# Patient Record
Sex: Female | Born: 1980 | Race: Black or African American | Hispanic: No | Marital: Single | Smoking: Current every day smoker
Health system: Southern US, Community
[De-identification: ages and names within clinical notes are randomized; demographics above are authoritative.]

## PROBLEM LIST (undated history)

## (undated) DIAGNOSIS — R87810 Cervical high risk human papillomavirus (HPV) DNA test positive: Secondary | ICD-10-CM

## (undated) DIAGNOSIS — F419 Anxiety disorder, unspecified: Secondary | ICD-10-CM

## (undated) HISTORY — DX: Cervical high risk human papillomavirus (HPV) DNA test positive: R87.810

## (undated) HISTORY — DX: Anxiety disorder, unspecified: F41.9

## (undated) HISTORY — PX: TUBAL LIGATION: SHX77

---
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
## 1999-10-17 ENCOUNTER — Emergency Department (HOSPITAL_COMMUNITY): Admission: EM | Admit: 1999-10-17 | Discharge: 1999-10-17 | Payer: Self-pay | Admitting: *Deleted

## 2004-05-28 ENCOUNTER — Inpatient Hospital Stay (HOSPITAL_COMMUNITY): Admission: AD | Admit: 2004-05-28 | Discharge: 2004-05-28 | Payer: Self-pay | Admitting: Obstetrics & Gynecology

## 2004-12-14 ENCOUNTER — Ambulatory Visit: Payer: Self-pay | Admitting: Obstetrics and Gynecology

## 2005-01-04 ENCOUNTER — Ambulatory Visit: Payer: Self-pay | Admitting: Obstetrics and Gynecology

## 2005-05-10 ENCOUNTER — Ambulatory Visit: Payer: Self-pay | Admitting: Obstetrics and Gynecology

## 2006-04-23 ENCOUNTER — Inpatient Hospital Stay (HOSPITAL_COMMUNITY): Admission: AD | Admit: 2006-04-23 | Discharge: 2006-04-23 | Payer: Self-pay | Admitting: Obstetrics & Gynecology

## 2006-05-26 ENCOUNTER — Inpatient Hospital Stay (HOSPITAL_COMMUNITY): Admission: AD | Admit: 2006-05-26 | Discharge: 2006-05-26 | Payer: Self-pay | Admitting: Obstetrics and Gynecology

## 2006-06-26 ENCOUNTER — Ambulatory Visit (HOSPITAL_COMMUNITY): Admission: RE | Admit: 2006-06-26 | Discharge: 2006-06-26 | Payer: Self-pay | Admitting: Family Medicine

## 2006-07-13 ENCOUNTER — Ambulatory Visit: Payer: Self-pay | Admitting: Obstetrics & Gynecology

## 2006-07-14 ENCOUNTER — Ambulatory Visit: Payer: Self-pay | Admitting: Gynecology

## 2006-08-03 ENCOUNTER — Ambulatory Visit (HOSPITAL_COMMUNITY): Admission: RE | Admit: 2006-08-03 | Discharge: 2006-08-03 | Payer: Self-pay | Admitting: Obstetrics & Gynecology

## 2006-08-30 ENCOUNTER — Ambulatory Visit: Payer: Self-pay | Admitting: Obstetrics & Gynecology

## 2006-08-31 ENCOUNTER — Ambulatory Visit: Payer: Self-pay | Admitting: *Deleted

## 2006-09-01 ENCOUNTER — Ambulatory Visit: Payer: Self-pay | Admitting: Gynecology

## 2006-09-07 ENCOUNTER — Ambulatory Visit: Payer: Self-pay | Admitting: Obstetrics & Gynecology

## 2006-09-21 ENCOUNTER — Ambulatory Visit: Payer: Self-pay | Admitting: Obstetrics & Gynecology

## 2006-09-21 ENCOUNTER — Ambulatory Visit (HOSPITAL_COMMUNITY): Admission: RE | Admit: 2006-09-21 | Discharge: 2006-09-21 | Payer: Self-pay | Admitting: Obstetrics & Gynecology

## 2006-10-02 ENCOUNTER — Ambulatory Visit: Payer: Self-pay | Admitting: Obstetrics & Gynecology

## 2006-10-08 ENCOUNTER — Inpatient Hospital Stay (HOSPITAL_COMMUNITY): Admission: AD | Admit: 2006-10-08 | Discharge: 2006-10-11 | Payer: Self-pay | Admitting: Obstetrics & Gynecology

## 2006-10-08 ENCOUNTER — Ambulatory Visit: Payer: Self-pay | Admitting: Obstetrics & Gynecology

## 2006-10-13 ENCOUNTER — Inpatient Hospital Stay (HOSPITAL_COMMUNITY): Admission: AD | Admit: 2006-10-13 | Discharge: 2006-10-13 | Payer: Self-pay | Admitting: Gynecology

## 2006-10-13 ENCOUNTER — Ambulatory Visit: Payer: Self-pay | Admitting: *Deleted

## 2008-07-23 IMAGING — US US OB COMP +14 WK
1 series · 14 of 14 positions shown · non-contrast
Comparison: none

OBSTETRICAL ULTRASOUND:

 This ultrasound exam was performed in the [HOSPITAL] Ultrasound Department.  The OB US report was generated in the AS system, and faxed to the ordering physician.  This report is also available in [REDACTED] PACS.

[Series 1: us ob comp +14 wk · 0.24mm/px · 14 of 14 slices shown]
[im 1/14]
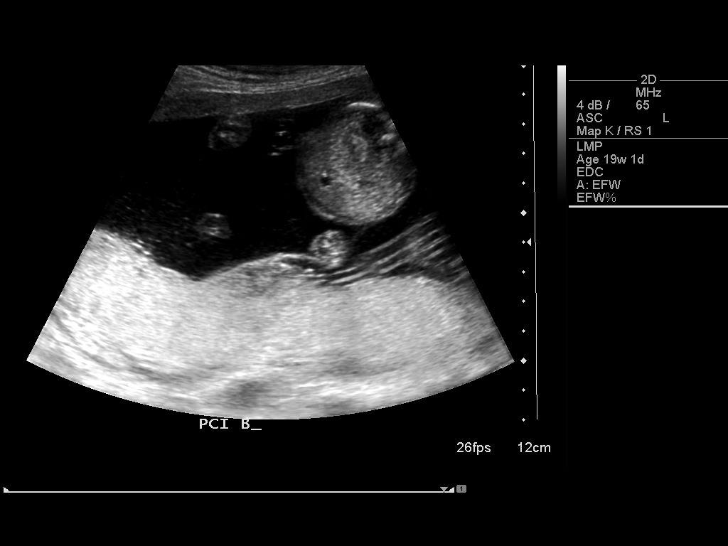
[im 2/14]
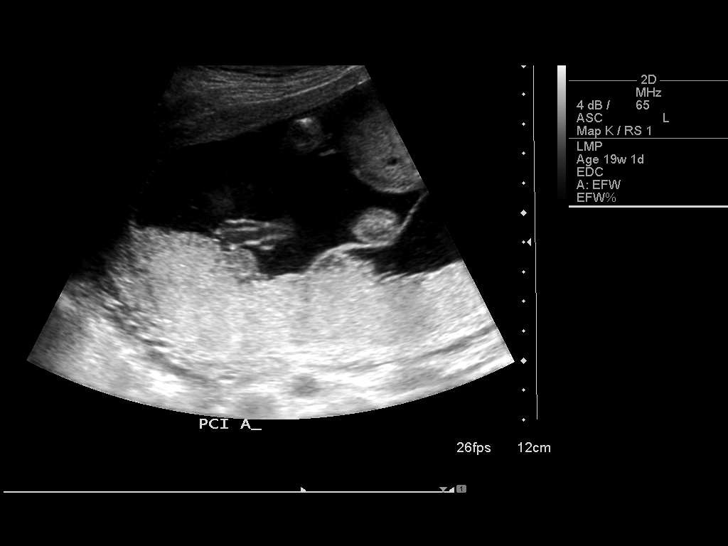
[im 3/14]
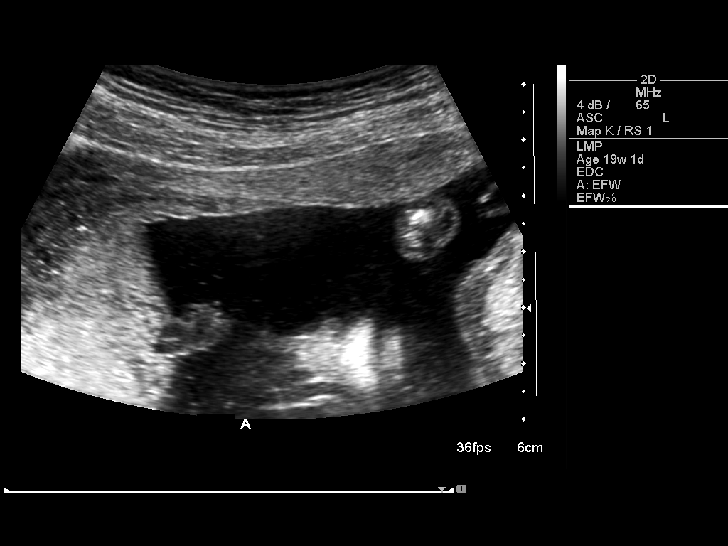
[im 4/14]
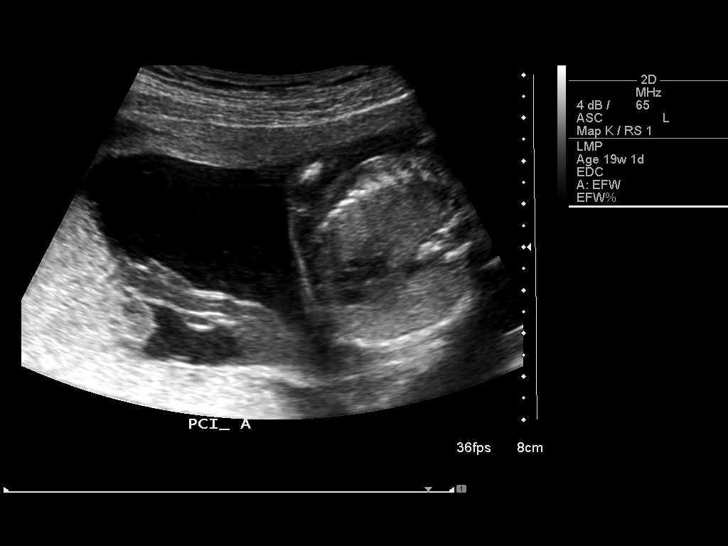
[im 5/14]
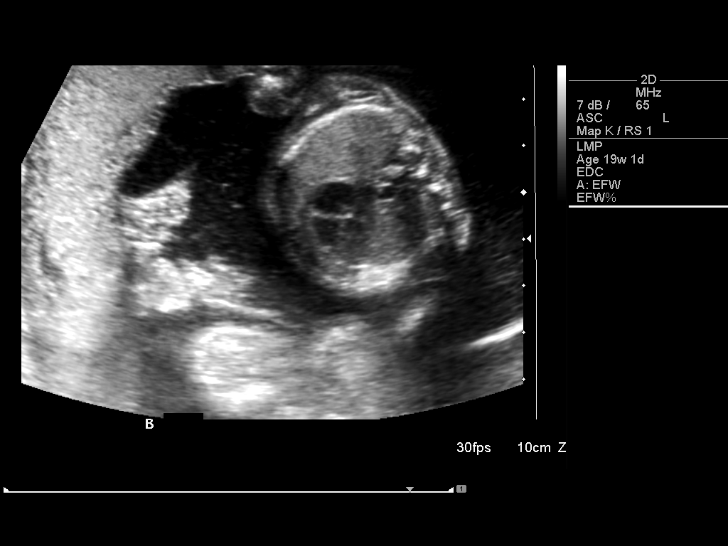
[im 6/14]
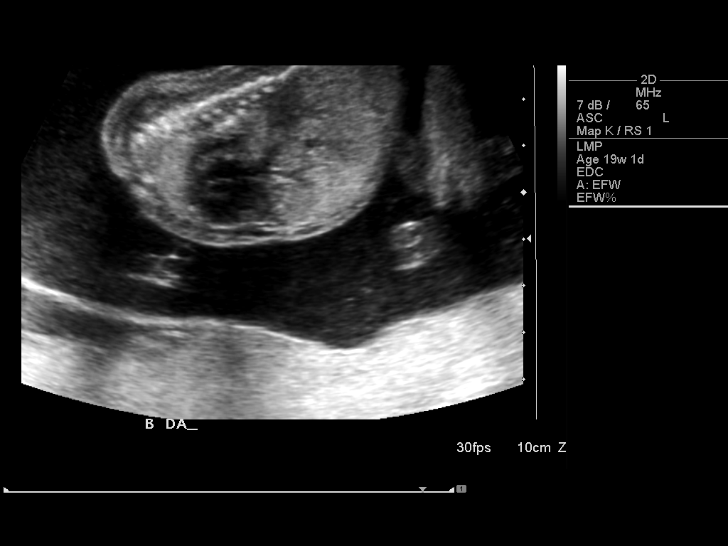
[im 7/14]
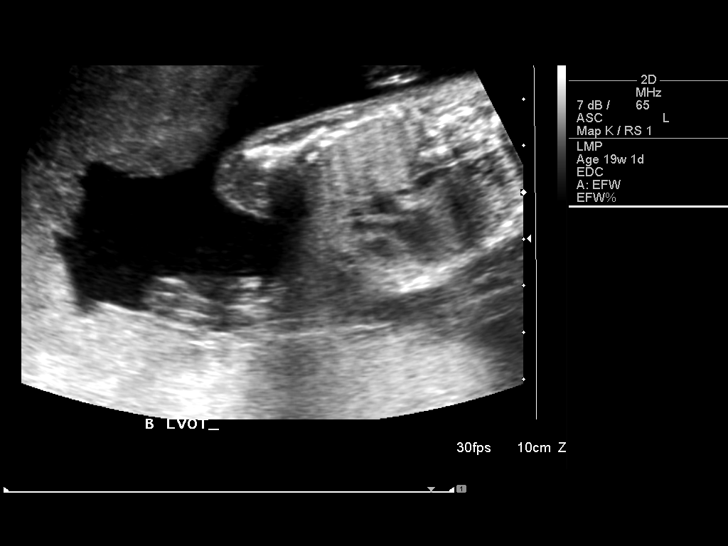
[im 8/14]
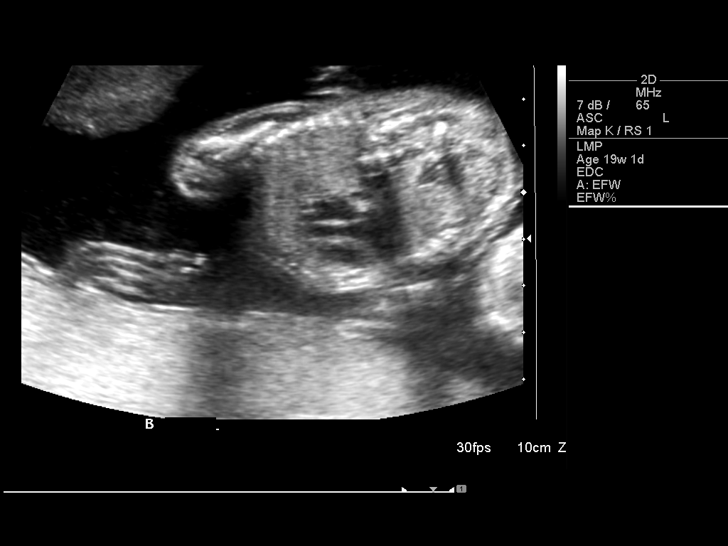
[im 9/14]
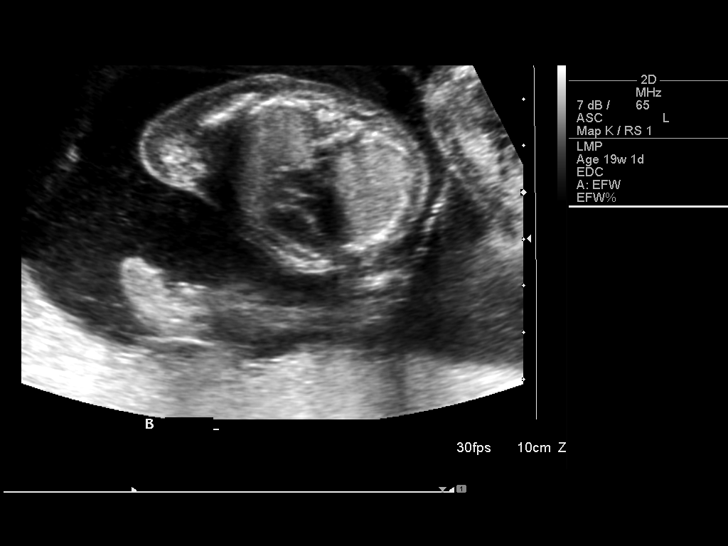
[im 10/14]
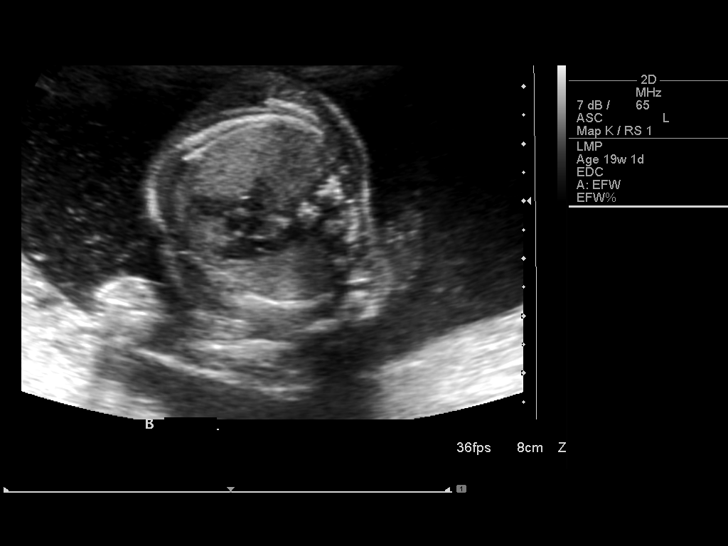
[im 11/14]
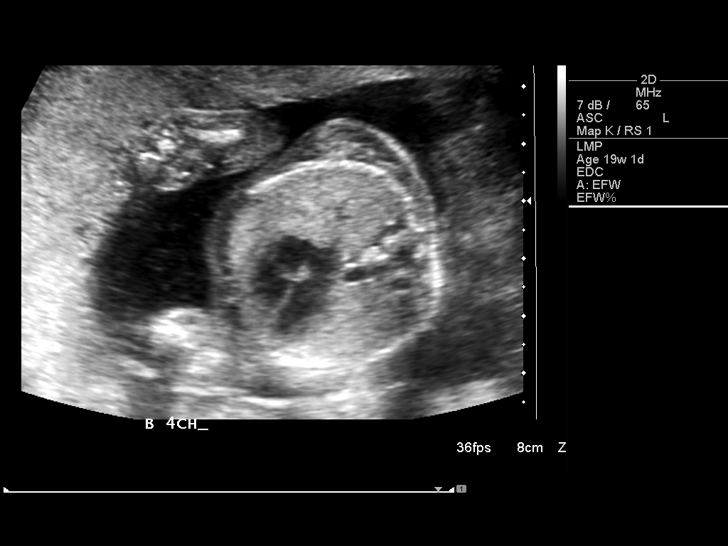
[im 12/14]
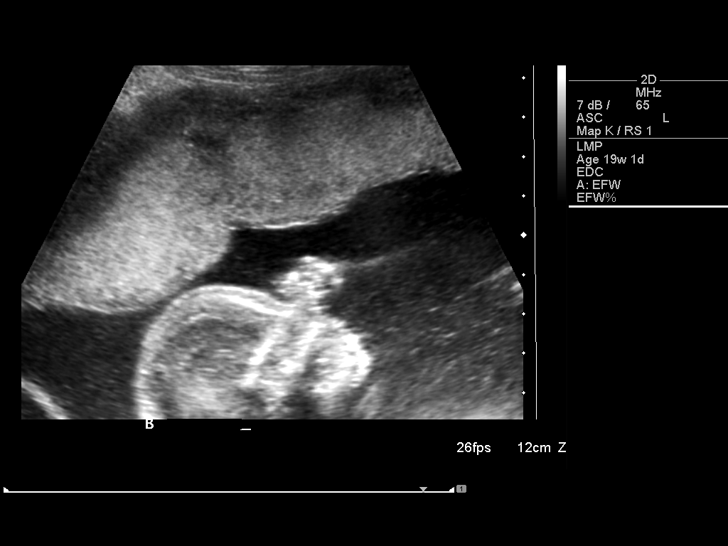
[im 13/14]
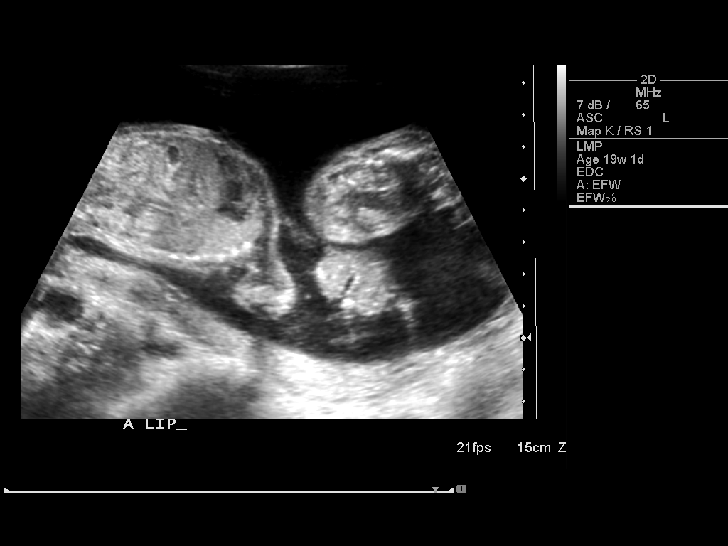
[im 14/14]
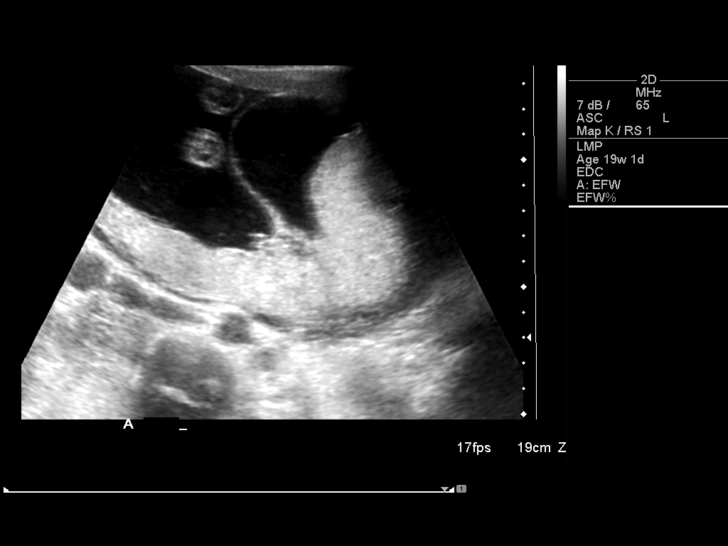

[14 of 14 positions shown; findings below may reference images not displayed]

IMPRESSION: See AS Obstetric US report.

## 2008-10-18 IMAGING — US US OB FOLLOW-UP
1 series · 14 of 28 positions shown · non-contrast
Comparison: none

OBSTETRICAL ULTRASOUND:

 This ultrasound exam was performed in the [HOSPITAL] Ultrasound Department.  The OB US report was generated in the AS system, and faxed to the ordering physician.  This report is also available in [REDACTED] PACS.

[Series 1: us ob follow-up · 14 of 58 slices shown]
[im 3/58]
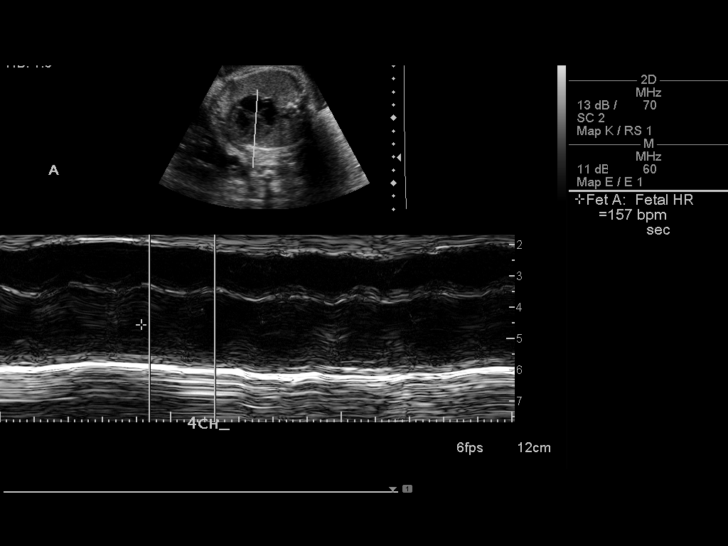
[im 7/58]
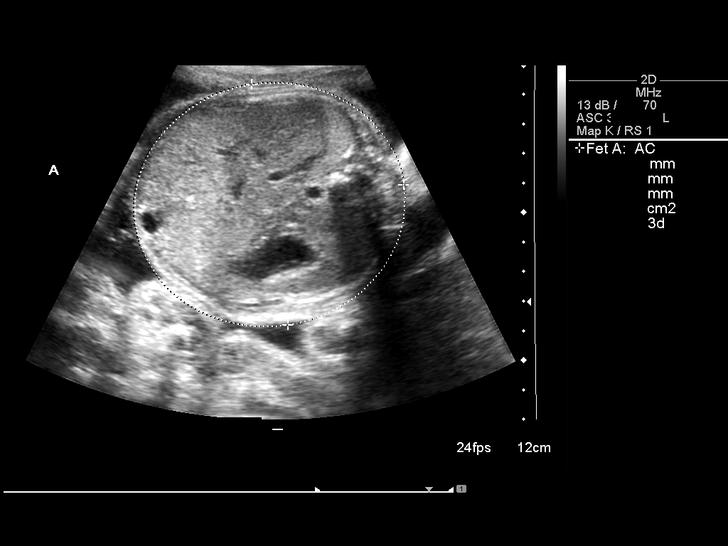
[im 11/58]
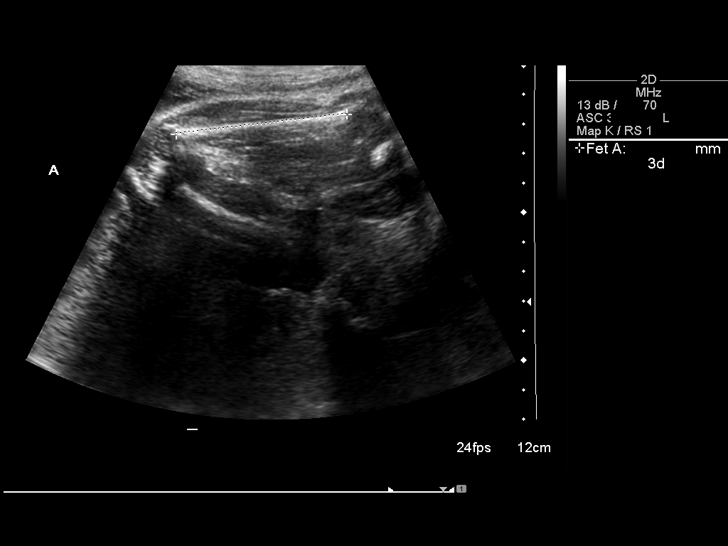
[im 15/58]
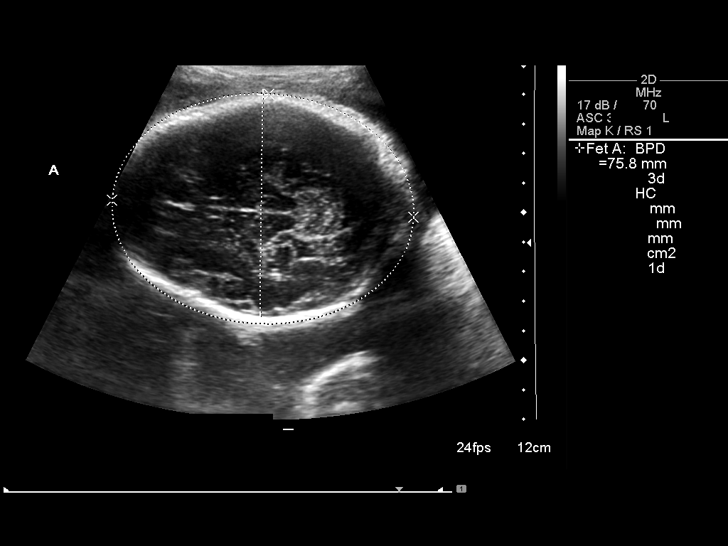
[im 20/58]
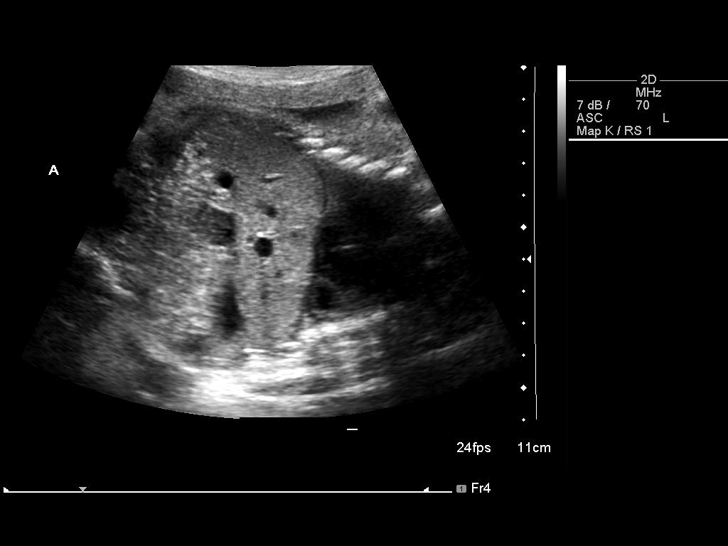
[im 24/58]
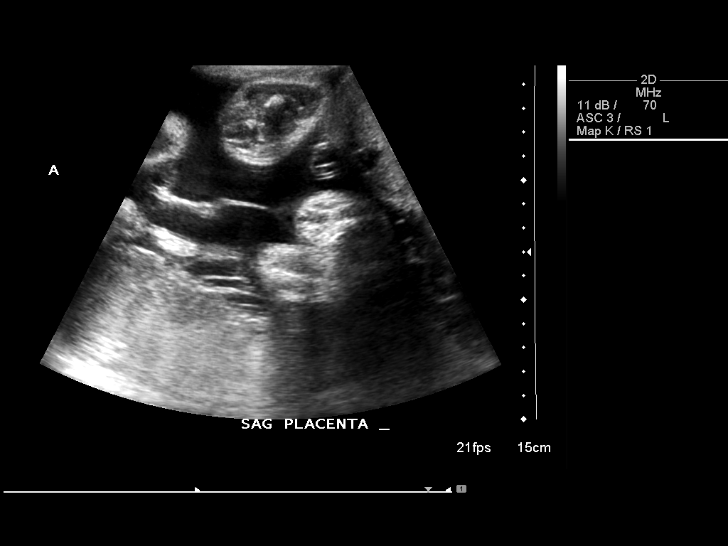
[im 28/58]
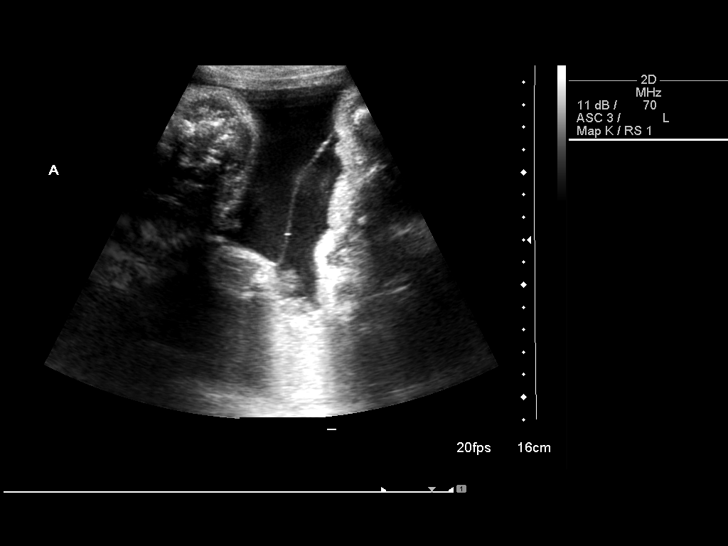
[im 32/58]
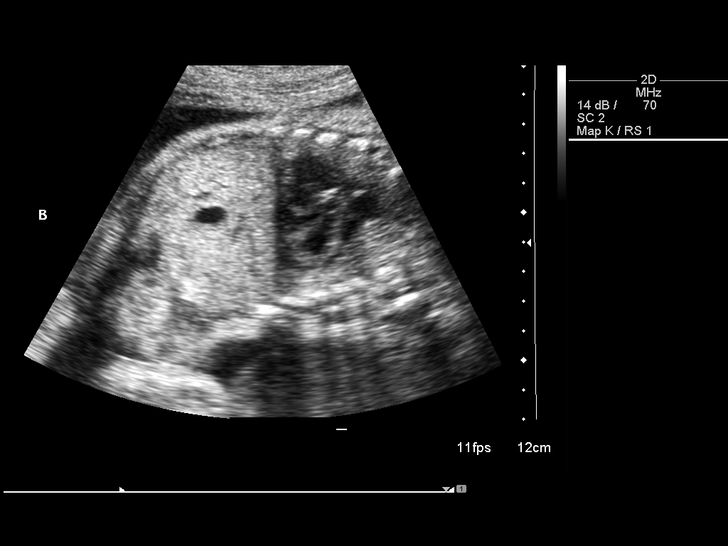
[im 36/58]
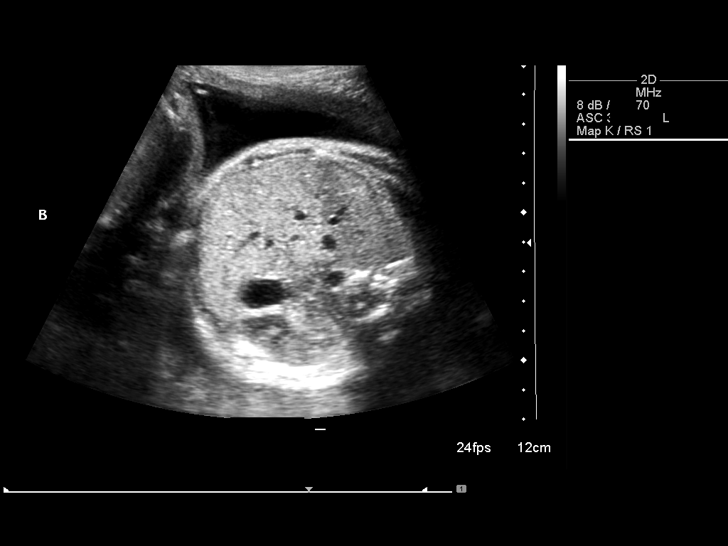
[im 41/58]
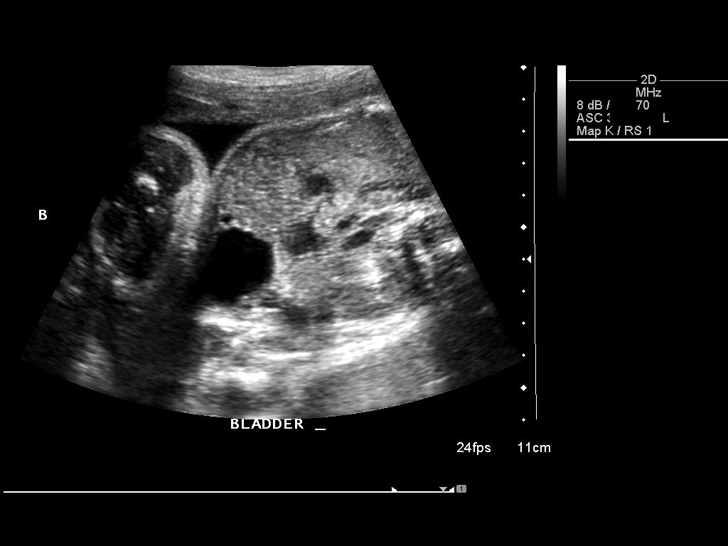
[im 45/58]
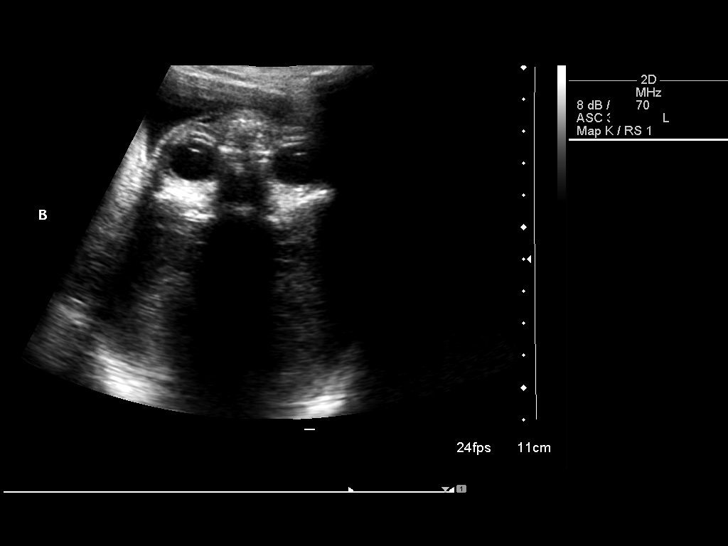
[im 49/58]
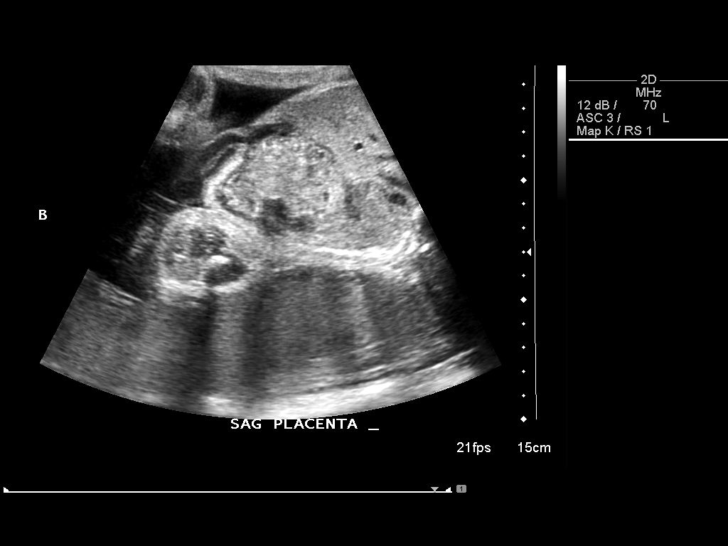
[im 53/58]
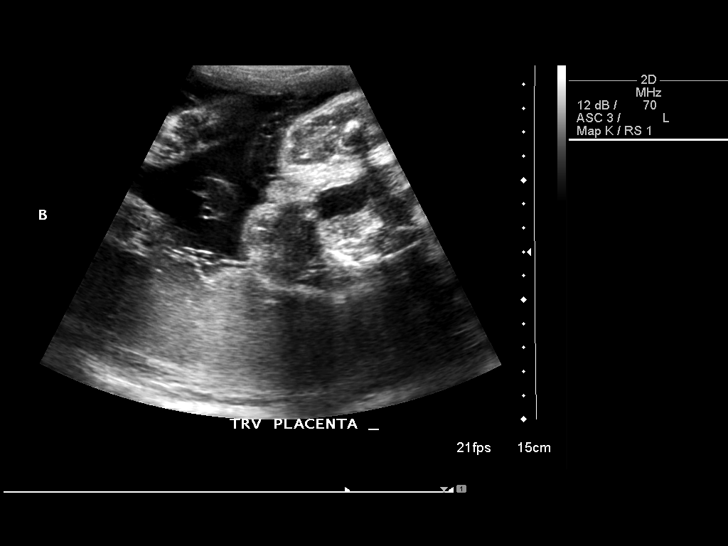
[im 58/58]
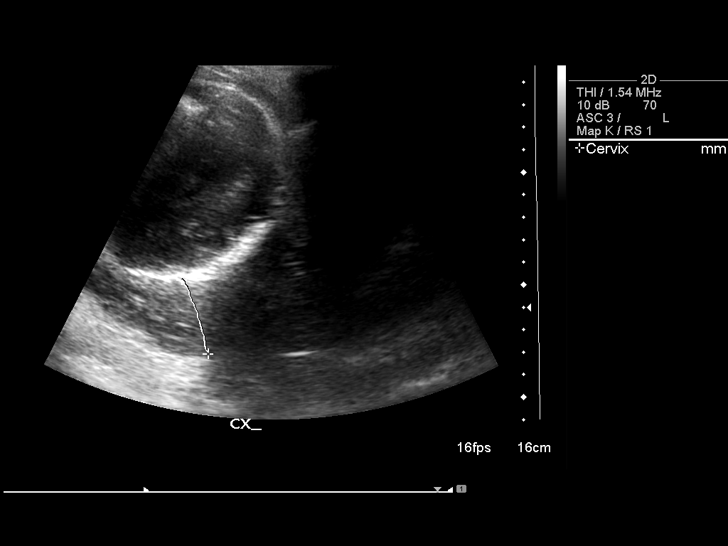

[14 of 28 positions shown; findings below may reference images not displayed]

IMPRESSION: See AS Obstetric US report.

## 2008-11-05 IMAGING — US US OB LIMITED
1 series · 14 of 24 positions shown · non-contrast
Comparison: none

OBSTETRICAL ULTRASOUND:

 This ultrasound exam was performed in the [HOSPITAL] Ultrasound Department.  The OB US report was generated in the AS system, and faxed to the ordering physician.  This report is also available in [REDACTED] PACS.

[Series 1: us ob limited · 0.32mm/px · 14 of 24 slices shown]
[im 1/24]
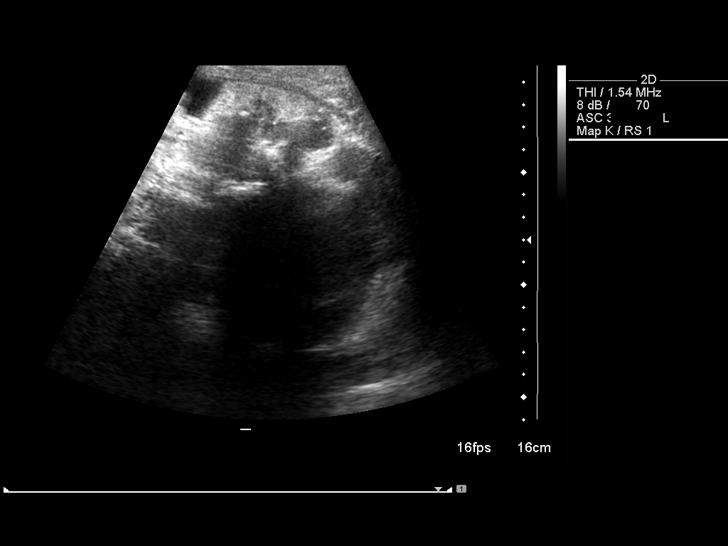
[im 3/24]
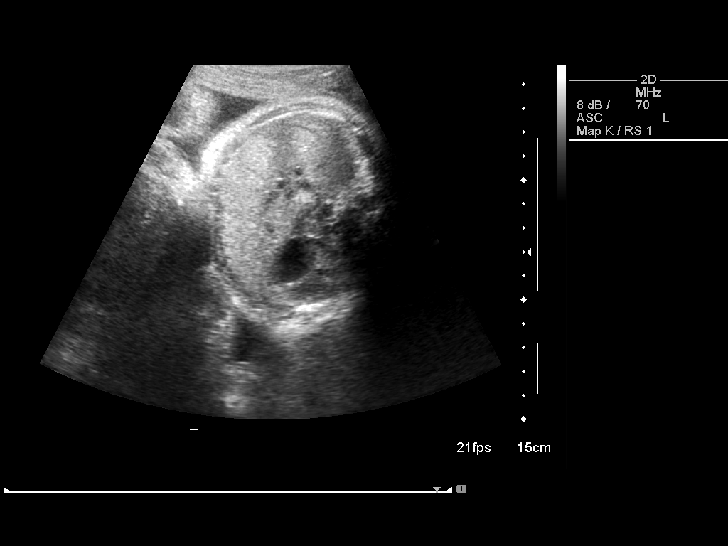
[im 5/24]
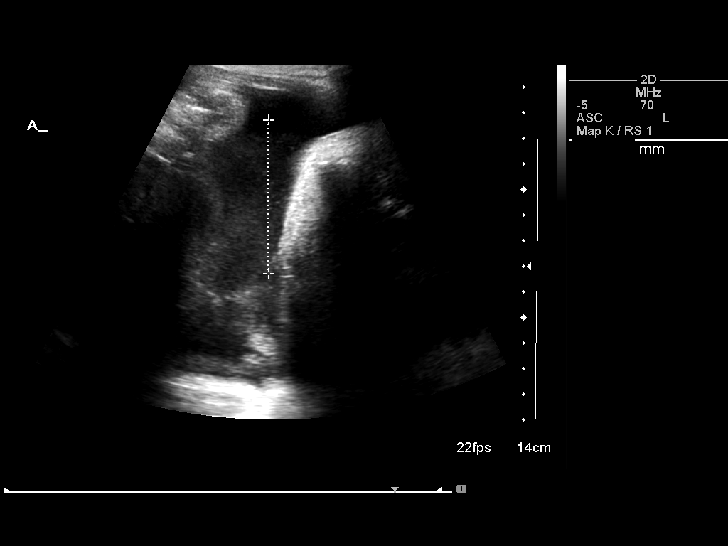
[im 7/24]
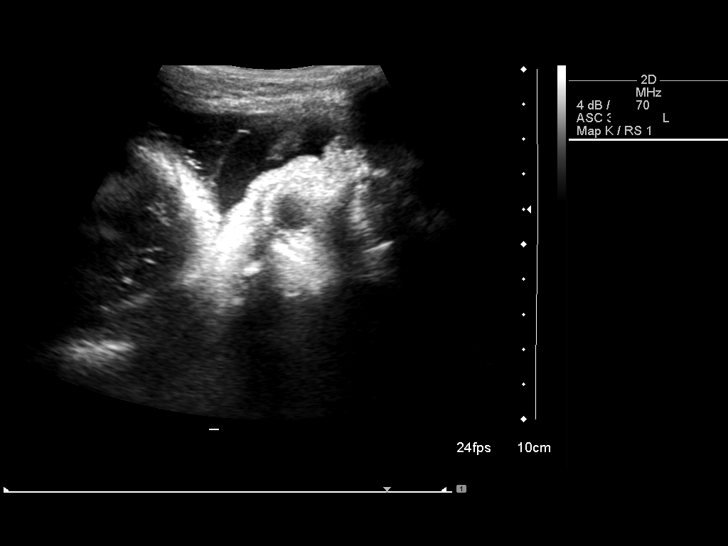
[im 8/24]
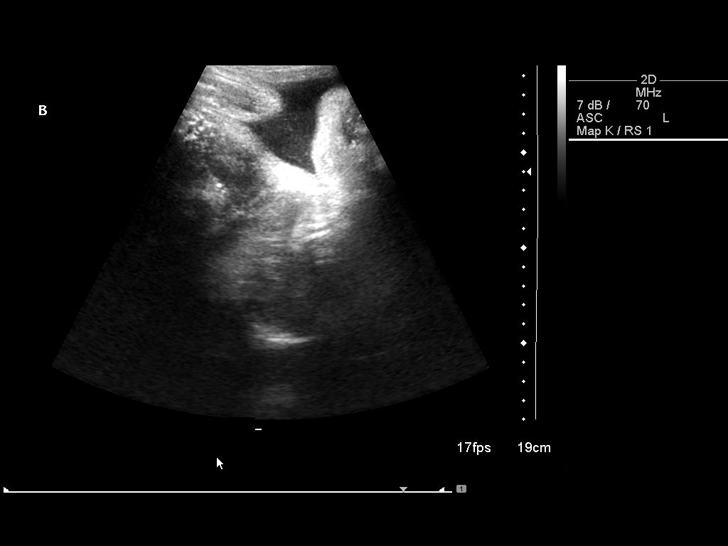
[im 10/24]
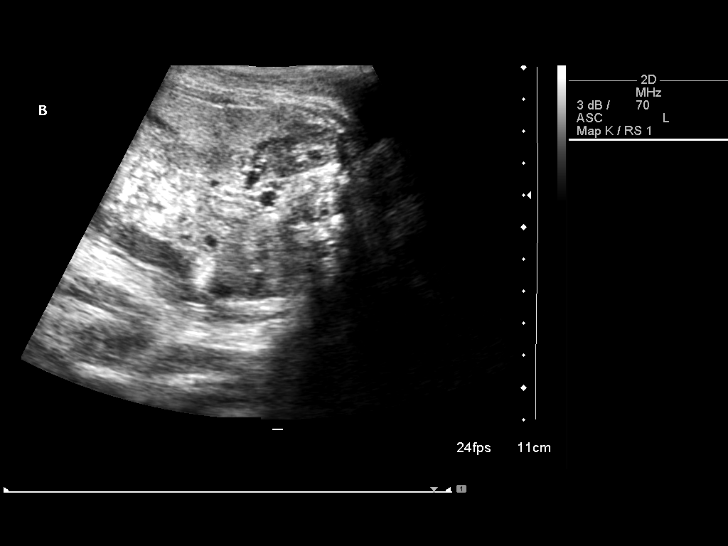
[im 12/24]
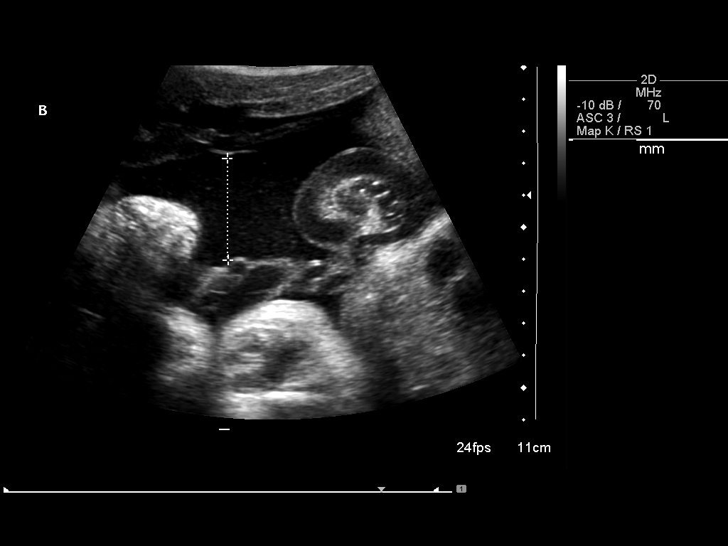
[im 13/24]
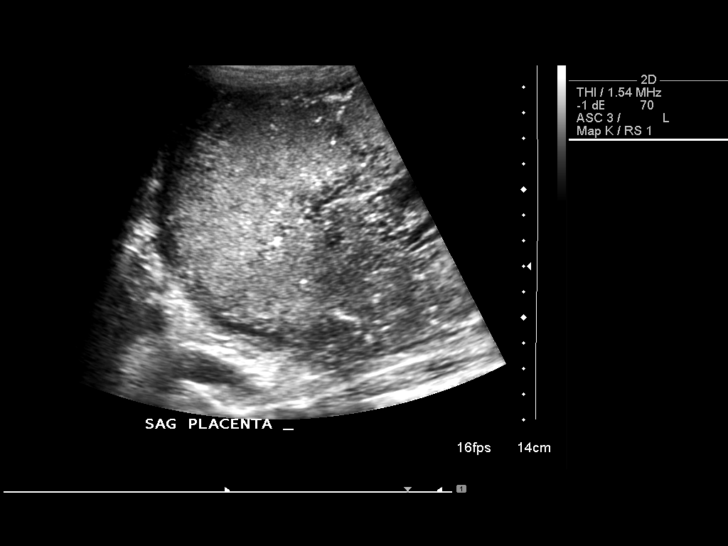
[im 15/24]
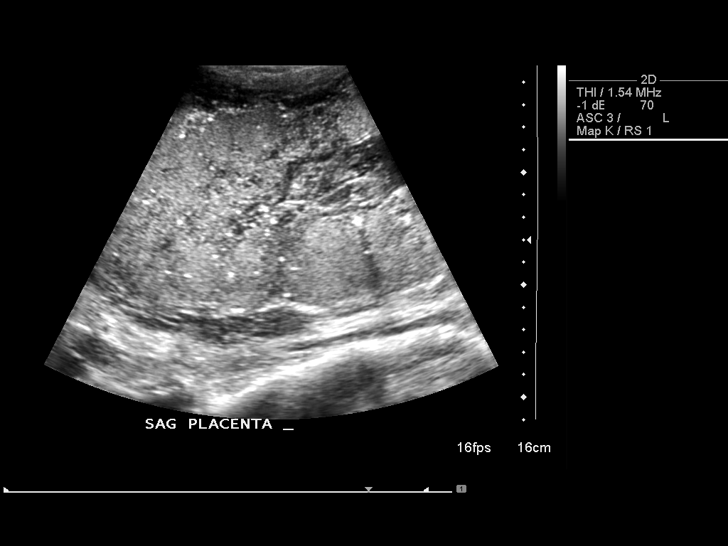
[im 17/24]
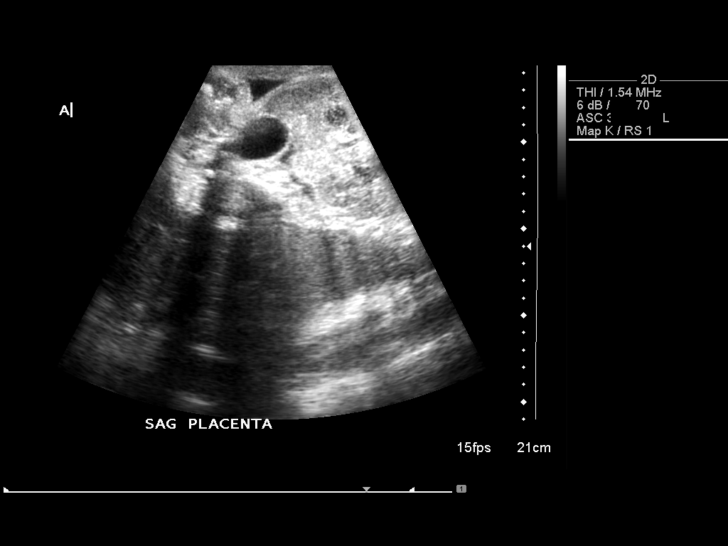
[im 19/24]
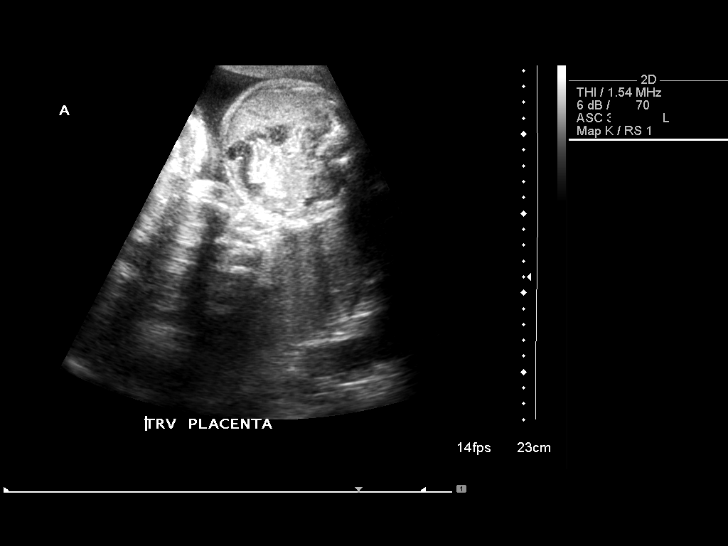
[im 20/24]
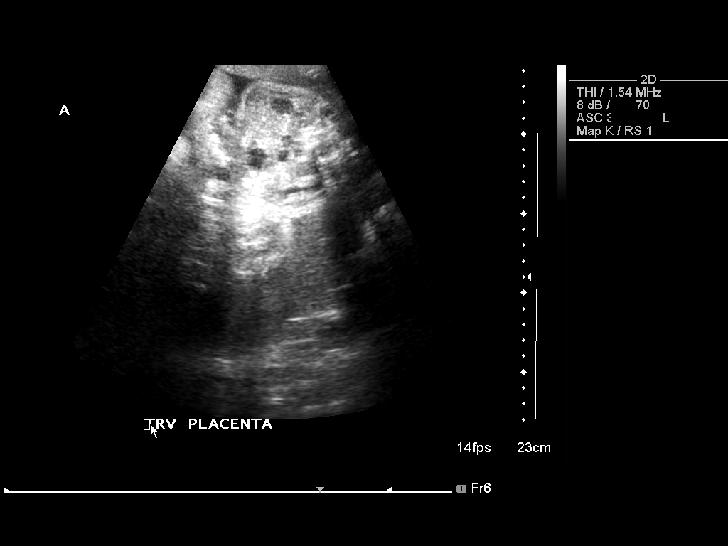
[im 22/24]
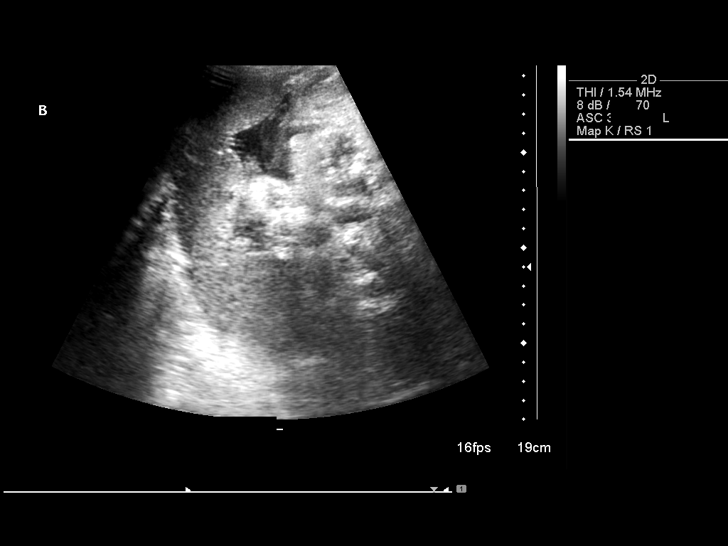
[im 24/24]
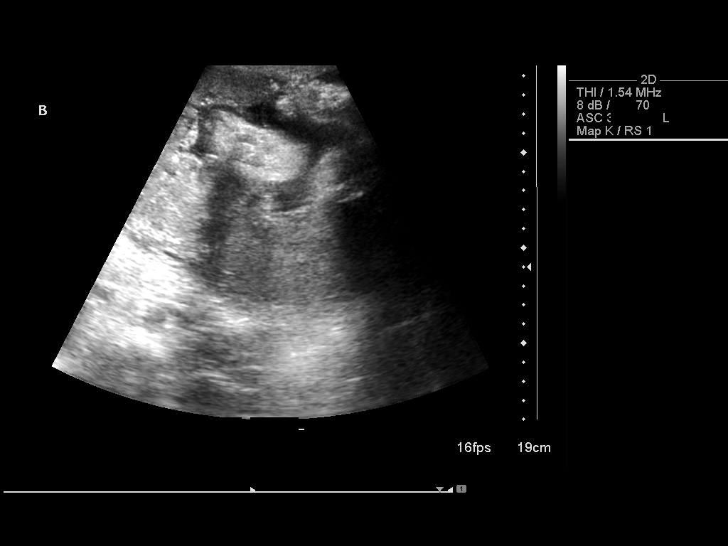

[14 of 24 positions shown; findings below may reference images not displayed]

IMPRESSION: See AS Obstetric US report.

## 2009-08-12 ENCOUNTER — Emergency Department (HOSPITAL_COMMUNITY): Admission: EM | Admit: 2009-08-12 | Discharge: 2009-08-12 | Payer: Self-pay | Admitting: Emergency Medicine

## 2009-08-18 ENCOUNTER — Emergency Department (HOSPITAL_COMMUNITY): Admission: EM | Admit: 2009-08-18 | Discharge: 2009-08-18 | Payer: Self-pay | Admitting: Family Medicine

## 2010-10-05 NOTE — Op Note (Signed)
NAME:  Krystal Bailey, Krystal Bailey              ACCOUNT NO.:  [id number]   MEDICAL RECORD NO.:  [id number]          PATIENT TYPE:  INP   LOCATION:  9320                          FACILITY:  [facility]   PHYSICIAN:  Allie Bossier, MD        DATE OF BIRTH:  Sep 22, 1980   DATE OF PROCEDURE:  10/10/2006  DATE OF DISCHARGE:                               OPERATIVE REPORT   PREOPERATIVE DIAGNOSIS:  Desires permanent sterilization.   POSTOPERATIVE DIAGNOSIS:  Desires permanent sterilization.   PROCEDURE:  Postpartum bilateral tubal ligation with Filshie clips.   SURGEON:  Dr. Nicholaus Bloom.   ASSISTANT:  Dr. Wilburt Finlay.   ANESTHESIA:  Epidural and local.   SPECIMEN:  None.   ESTIMATED BLOOD LOSS:  Minimal.   COMPLICATIONS:  None.   INDICATIONS:  This is a 30 year old gravida 3, para 3-0-0-4, status post  spontaneous vaginal delivery who desires permanent sterilization.  Risks  and benefits of the procedure discussed with the patient including risk  of failure and increased risk of ectopic gestation if pregnancy occurs.   PROCEDURE:  The patient was taken to the operating room where her  epidural was found to be adequate.  A small vertical umbilical skin  incision was then made with a scalpel.  The incision was carried down to  the underlying layer of fascia until the peritoneum was identified and  entered.  The peritoneum was noted to be free of any adhesions, and the  incision was extended with the Metzenbaum scissors.  The patient's right  fallopian tube was then identified, brought to the incision and grasped  with a Babcock clamp.  The tube was then followed out to the fimbria.  The Babcock clamp was then used to grasp the tube, and a Filshie clip  was placed in the isthmus region of the tube.  Marcaine 0.5 mL was  injected into the tube.  Good hemostasis was noted, and the tube was  returned to the abdomen.  The patient's left fallopian tube was then  identified, brought to the incision and grasped  with the Babcock clamp.  This tube was also followed out to its fimbria.  The Babcock clamp was  then used to grasp the tube, and a Filshie clip was placed in the  isthmus region of the tube.  Marcaine 0.5 mL was injected on either side  of the clip.  Good hemostasis was noted, and the tube was returned to  the abdomen.  The fascia was then closed in a single layer using  3-0 Vicryl.  The skin was closed in a subcuticular fashion using 3-0  Vicryl on a PS2 needle.  The patient tolerated the procedure well.  Sponge, lap and needle counts were correct x2.  The patient was taken to  recovery room in stable condition.     ______________________________  [name], MD      Allie Bossier, MD  Electronically Signed    LNJ/MEDQ  D:  10/10/2006  T:  10/10/2006  Job:  [id number]

## 2010-10-08 NOTE — Group Therapy Note (Signed)
NAME:  SKYLEEN, BENTLEY              ACCOUNT NO.:  [id number]   MEDICAL RECORD NO.:  [id number]          PATIENT TYPE:  WOC   LOCATION:  [facility]                   FACILITY:  [facility]   PHYSICIAN:  Argentina Donovan, MD        DATE OF BIRTH:  02-21-1981   DATE OF SERVICE:  12/14/2004                                    CLINIC NOTE   CHIEF COMPLAINT:  LEEP evaluation, referral from [facility].   HISTORY OF PRESENT ILLNESS:  Ms. 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 The patient denies any previous LEEP  procedures or cryotherapy on cervix. The patient denies any complaints of  concerns at this office visit. She denies any abnormal vaginal bleeding,  abdominal pain or tenderness with intercourse. The patient has good insight  as to why she is here and understands her history of abnormal Pap and why  she is being referred here for LEEP evaluation.   PAST MEDICAL HISTORY:  None.   OBSTETRIC HISTORY:  G2, P2 female with vaginal delivery x2. No history of  abortions/pregnancy terminations. The patient is not interested in having  more children any time soon.   MEDICATIONS:  Depo-Provera one injection q.3 months.   ALLERGIES:  No known drug allergies.   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 She  denies tobacco use. She uses occasional alcohol when she goes to the club  (about once a week). She does have a history of occasional marijuana use  (about once a week). The patient is single.   FAMILY HISTORY:  Mother with no medical problems. Father unknown.   PAST MEDICAL HISTORY:  Healthy siblings. Healthy maternal aunt with breast  cancer. No history of familial uterine, ovarian or cervical cancers.   PHYSICAL EXAMINATION:  VITAL SIGNS:  Temperature 99.0, pulse 67, blood  pressure 102/66. Weight 128.8.  GENERAL:  Alert and oriented female in no  apparent distress.  SPECULUM EXAM:  Cervix digitalized with attending, 0.5 cm x 0.5 cm, cellular  abnormality surrounding squamocolumnar junction.   ASSESSMENT/PLAN:  The patient was given options of LEEP procedure versus  cryotherapy with risk and benefits. The patient states that she does not  want any more children any time soon. However she may want children in the  future. The patient states she would like to have cryotherapy performed  first, and repeat Pap smear and  monitor abnormal Pap resolution and then more towards a LEEP procedure if  appropriate. Therefore will schedule cryotherapy in 1-2 weeks at first  available appointment, per patient's request. Repeat Pap in 4-6 months.       PR/MEDQ  D:  12/14/2004  T:  12/15/2004  Job:  [id number]   cc:   [facility]

## 2011-08-10 ENCOUNTER — Encounter (HOSPITAL_COMMUNITY): Payer: Self-pay | Admitting: *Deleted

## 2011-08-10 ENCOUNTER — Emergency Department (HOSPITAL_COMMUNITY)
Admission: EM | Admit: 2011-08-10 | Discharge: 2011-08-10 | Disposition: A | Discharge status: Home / Self Care | Payer: Medicaid Other | Attending: Emergency Medicine | Admitting: Emergency Medicine

## 2011-08-10 DIAGNOSIS — R1110 Vomiting, unspecified: Secondary | ICD-10-CM | POA: Insufficient documentation

## 2011-08-10 LAB — COMPREHENSIVE METABOLIC PANEL
ALT: 12 U/L (ref 0–35)
AST: 30 U/L (ref 0–37)
CO2: 30 mEq/L (ref 19–32)
Chloride: 97 mEq/L (ref 96–112)
Creatinine, Ser: 0.68 mg/dL (ref 0.50–1.10)
GFR calc Af Amer: >90 mL/min (ref >90)
GFR calc non Af Amer: >90 mL/min (ref >90)
Glucose, Bld: 90 mg/dL (ref 70–99)
Total Bilirubin: 0.4 mg/dL (ref 0.3–1.2)

## 2011-08-10 LAB — DIFFERENTIAL
Basophils Relative: 0 % (ref 0–1)
Eosinophils Absolute: 0.1 10*3/uL (ref 0.0–0.7)
Eosinophils Relative: 2 % (ref 0–5)
Monocytes Absolute: 0.2 10*3/uL (ref 0.1–1.0)
Neutro Abs: 2.5 10*3/uL (ref 1.7–7.7)
Neutrophils Relative %: 53 % (ref 43–77)

## 2011-08-10 LAB — CBC
HCT: 46.6 % — ABNORMAL HIGH (ref 36.0–46.0)
Hemoglobin: 16.2 g/dL — ABNORMAL HIGH (ref 12.0–15.0)
MCH: 32.1 pg (ref 26.0–34.0)
MCHC: 34.8 g/dL (ref 30.0–36.0)
MCV: 92.3 fL (ref 78.0–100.0)
RBC: 5.05 MIL/uL (ref 3.87–5.11)

## 2011-08-10 LAB — URINALYSIS, ROUTINE W REFLEX MICROSCOPIC
Hgb urine dipstick: NEGATIVE
Nitrite: NEGATIVE
Protein, ur: 100 mg/dL — AB
Urobilinogen, UA: 1 mg/dL (ref 0.0–1.0)

## 2011-08-10 LAB — URINE MICROSCOPIC-ADD ON

## 2011-08-10 MED ORDER — PROMETHAZINE HCL 25 MG PO TABS: 25.0000 mg | ORAL_TABLET | Freq: Four times a day (QID) | ORAL | Status: AC | PRN

## 2011-08-10 MED ORDER — SODIUM CHLORIDE 0.9 % IV BOLUS (SEPSIS)
1000.0000 mL | Freq: Once | INTRAVENOUS | Status: AC
  Administered 2011-08-10: 1000 mL via INTRAVENOUS

## 2011-08-10 NOTE — ED Notes (Signed)
Pt states she has n/v/d for about 3 days . Pt states her children have been sick and she thinks that she has caught something from them. Pt states she unable to keep po's down

## 2011-08-10 NOTE — Discharge Instructions (Signed)
Drink plenty of fluids and follow up as needed °

## 2011-08-10 NOTE — ED Provider Notes (Signed)
History     CSN: [id number]  Arrival date & time 08/10/11  4098   First MD Initiated Contact with Patient 08/10/11 1110      Chief Complaint  Patient presents with  . Nausea  . Emesis    (Consider location/radiation/quality/duration/timing/severity/associated sxs/prior treatment) Patient is a 31 y.o. female presenting with vomiting. The history is provided by the patient (Patient complains of vomiting today. She has no abdominal pain. She states that her children have been vomiting a lot lately. Patient vomiting up any blood.). No language interpreter was used.  Emesis  This is a new problem. The current episode started 12 to 24 hours ago. The problem occurs 5 to 10 times per day. The problem has not changed since onset.The emesis has an appearance of stomach contents. There has been no fever (no fever). Pertinent negatives include no abdominal pain, no chills, no cough, no diarrhea, no fever and no headaches. Risk factors include ill contacts.    History reviewed. No pertinent past medical history.  Past Surgical History  Procedure Date  . Tubal ligation     No family history on file.  History  Substance Use Topics  . Smoking status: Never Smoker   . Smokeless tobacco: Not on file  . Alcohol Use: No    OB History    Grav Para Term Preterm Abortions TAB SAB Ect Mult Living                  Review of Systems  Constitutional: Negative for fever, chills and fatigue.  HENT: Negative for congestion, sinus pressure and ear discharge.   Eyes: Negative for discharge.  Respiratory: Negative for cough.   Cardiovascular: Negative for chest pain.  Gastrointestinal: Positive for vomiting. Negative for abdominal pain and diarrhea.  Genitourinary: Negative for frequency and hematuria.  Musculoskeletal: Negative for back pain.  Skin: Negative for rash.  Neurological: Negative for seizures and headaches.  Hematological: Negative.   Psychiatric/Behavioral: Negative for  hallucinations.    Allergies  Review of patient's allergies indicates no known allergies.  Home Medications   Current Outpatient Rx  Name Route Sig Dispense Refill  . IBUPROFEN 200 MG PO TABS Oral Take 400 mg by mouth every 6 (six) hours as needed. For pain    . PHENYLEPHRINE-DM 2.5-5 MG/5ML PO SOLN Oral Take 15 mLs by mouth. For cold    . PROMETHAZINE HCL 25 MG PO TABS Oral Take 1 tablet (25 mg total) by mouth every 6 (six) hours as needed for nausea. 15 tablet 0    BP 115/55  Pulse 60  Temp(Src) 98.2 F (36.8 C) (Oral)  Resp 17  SpO2 100%  LMP 07/27/2011  Physical Exam  Constitutional: She is oriented to person, place, and time. She appears well-developed.  HENT:  Head: Normocephalic and atraumatic.  Eyes: Conjunctivae and EOM are normal. No scleral icterus.  Neck: Neck supple. No thyromegaly present.  Cardiovascular: Normal rate and regular rhythm.  Exam reveals no gallop and no friction rub.   No murmur heard. Pulmonary/Chest: No stridor. She has no wheezes. She has no rales. She exhibits no tenderness.  Abdominal: She exhibits no distension. There is no tenderness. There is no rebound.  Musculoskeletal: Normal range of motion. She exhibits no edema.  Lymphadenopathy:    She has no cervical adenopathy.  Neurological: She is oriented to person, place, and time. Coordination normal.  Skin: No rash noted. No erythema.  Psychiatric: She has a normal mood and affect. Her behavior is  normal.    ED Course  Procedures (including critical care time)  Labs Reviewed  CBC - Abnormal; Notable for the following:    Hemoglobin 16.2 (*)    HCT 46.6 (*)    Platelets 111 (*)    All other components within normal limits  COMPREHENSIVE METABOLIC PANEL - Abnormal; Notable for the following:    Potassium 3.0 (*)    Total Protein 8.9 (*)    All other components within normal limits  URINALYSIS, ROUTINE W REFLEX MICROSCOPIC - Abnormal; Notable for the following:    Color, Urine  AMBER (*) BIOCHEMICALS MAY BE AFFECTED BY COLOR   APPearance CLOUDY (*)    Specific Gravity, Urine 1.035 (*)    Bilirubin Urine SMALL (*)    Ketones, ur 15 (*)    Protein, ur 100 (*)    Leukocytes, UA TRACE (*)    All other components within normal limits  URINE MICROSCOPIC-ADD ON - Abnormal; Notable for the following:    Squamous Epithelial / LPF FEW (*)    Bacteria, UA FEW (*)    Casts HYALINE CASTS (*) GRANULAR CAST   All other components within normal limits  DIFFERENTIAL  PREGNANCY, URINE   No results found.   1. Vomiting       MDM  Tender epigastric        Benny Lennert, MD 08/10/11 1350

## 2013-10-30 LAB — PROCEDURE REPORT - SCANNED: PAP SMEAR: NEGATIVE

## 2016-02-10 ENCOUNTER — Encounter: Payer: Self-pay | Admitting: *Deleted

## 2016-02-10 ENCOUNTER — Ambulatory Visit (INDEPENDENT_AMBULATORY_CARE_PROVIDER_SITE_OTHER): Payer: Medicaid Other | Admitting: Obstetrics & Gynecology

## 2016-02-10 ENCOUNTER — Encounter: Payer: Self-pay | Admitting: Obstetrics & Gynecology

## 2016-02-10 VITALS — BP 108/68 | HR 61 | Temp 98.9°F | Wt 113.6 lb

## 2016-02-10 DIAGNOSIS — R87810 Cervical high risk human papillomavirus (HPV) DNA test positive: Secondary | ICD-10-CM

## 2016-02-10 DIAGNOSIS — Z01419 Encounter for gynecological examination (general) (routine) without abnormal findings: Secondary | ICD-10-CM | POA: Diagnosis not present

## 2016-02-10 DIAGNOSIS — Z0000 Encounter for general adult medical examination without abnormal findings: Secondary | ICD-10-CM | POA: Diagnosis not present

## 2016-02-10 HISTORY — DX: Cervical high risk human papillomavirus (HPV) DNA test positive: R87.810

## 2016-02-10 NOTE — Addendum Note (Signed)
Addended by: Jaynie CollinsANYANWU, Christalyn Goertz A on: 02/10/2016 03:20 PM   Modules accepted: Orders

## 2016-02-10 NOTE — Patient Instructions (Signed)
Thank you for enrolling in [facility]. Please follow the instructions below to securely access your online medical record. MyChart allows you to send messages to your doctor, view your test results, manage appointments, and more.   How Do I Sign Up? 1. In your Internet browser, go to [employer] and enter [email or web address]. 2. Click on the Sign Up Now link in the Sign In box. You will see the New Member Sign Up page. 3. Enter your MyChart Access Code exactly as it appears below. You will not need to use this code after you've completed the sign-up process. If you do not sign up before the expiration date, you must request a new code.  MyChart Access Code: [phone] Expires: 04/10/2016  2:49 PM  4. Enter your Social Security Number ([email or web address]) and Date of Birth (mm/dd/yyyy) as indicated and click Submit. You will be taken to the next sign-up page. 5. Create a MyChart ID. This will be your MyChart login ID and cannot be changed, so think of one that is secure and easy to remember. 6. Create a MyChart password. You can change your password at any time. 7. Enter your Password Reset Question and Answer. This can be used at a later time if you forget your password.  8. Enter your e-mail address. You will receive e-mail notification when new information is available in [place]. 9. Click Sign Up. You can now view your medical record.   Additional Information Remember, MyChart is NOT to be used for urgent needs. For medical emergencies, dial 911.  Preventive Care for Adults, Female A healthy lifestyle and preventive care can promote health and wellness. Preventive health guidelines for women include the following key practices.  A routine yearly physical is a good way to check with your health care provider about your health and preventive screening. It is a chance to share any concerns and updates on your health and to receive a thorough exam.  Visit your dentist for a  routine exam and preventive care every 6 months. Brush your teeth twice a day and floss once a day. Good oral hygiene prevents tooth decay and gum disease.  The frequency of eye exams is based on your age, health, family medical history, use of contact lenses, and other factors. Follow your health care provider's recommendations for frequency of eye exams.  Eat a healthy diet. Foods like vegetables, fruits, whole grains, low-fat dairy products, and lean protein foods contain the nutrients you need without too many calories. Decrease your intake of foods high in solid fats, added sugars, and salt. Eat the right amount of calories for you.Get information about a proper diet from your health care provider, if necessary.  Regular physical exercise is one of the most important things you can do for your health. Most adults should get at least 150 minutes of moderate-intensity exercise (any activity that increases your heart rate and causes you to sweat) each week. In addition, most adults need muscle-strengthening exercises on 2 or more days a week.  Maintain a healthy weight. The body mass index (BMI) is a screening tool to identify possible weight problems. It provides an estimate of body fat based on height and weight. Your health care provider can find your BMI and can help you achieve or maintain a healthy weight.For adults 20 years and older:  A BMI below 18.5 is considered underweight.  A BMI of 18.5 to 24.9 is normal.  A BMI of 25 to 29.9 is considered overweight.  A BMI  of 30 and above is considered obese.  Maintain normal blood lipids and cholesterol levels by exercising and minimizing your intake of saturated fat. Eat a balanced diet with plenty of fruit and vegetables. Blood tests for lipids and cholesterol should begin at age 40 and be repeated every 5 years. If your lipid or cholesterol levels are high, you are over 50, or you are at high risk for heart disease, you may need your  cholesterol levels checked more frequently.Ongoing high lipid and cholesterol levels should be treated with medicines if diet and exercise are not working.  If you smoke, find out from your health care provider how to quit. If you do not use tobacco, do not start.  Lung cancer screening is recommended for adults aged 73-80 years who are at high risk for developing lung cancer because of a history of smoking. A yearly low-dose CT scan of the lungs is recommended for people who have at least a 30-pack-year history of smoking and are a current smoker or have quit within the past 15 years. A pack year of smoking is smoking an average of 1 pack of cigarettes a day for 1 year (for example: 1 pack a day for 30 years or 2 packs a day for 15 years). Yearly screening should continue until the smoker has stopped smoking for at least 15 years. Yearly screening should be stopped for people who develop a health problem that would prevent them from having lung cancer treatment.  If you are pregnant, do not drink alcohol. If you are breastfeeding, be very cautious about drinking alcohol. If you are not pregnant and choose to drink alcohol, do not have more than 1 drink per day. One drink is considered to be 12 ounces (355 mL) of beer, 5 ounces (148 mL) of wine, or 1.5 ounces (44 mL) of liquor.  Avoid use of street drugs. Do not share needles with anyone. Ask for help if you need support or instructions about stopping the use of drugs.  High blood pressure causes heart disease and increases the risk of stroke. Your blood pressure should be checked at least every 1 to 2 years. Ongoing high blood pressure should be treated with medicines if weight loss and exercise do not work.  If you are 47-56 years old, ask your health care provider if you should take aspirin to prevent strokes.  Diabetes screening is done by taking a blood sample to check your blood glucose level after you have not eaten for a certain period of time  (fasting). If you are not overweight and you do not have risk factors for diabetes, you should be screened once every 3 years starting at age 1. If you are overweight or obese and you are 19-2 years of age, you should be screened for diabetes every year as part of your cardiovascular risk assessment.  Breast cancer screening is essential preventive care for women. You should practice "breast self-awareness." This means understanding the normal appearance and feel of your breasts and may include breast self-examination. Any changes detected, no matter how small, should be reported to a health care provider. Women in their 38s and 30s should have a clinical breast exam (CBE) by a health care provider as part of a regular health exam every 1 to 3 years. After age 69, women should have a CBE every year. Starting at age 34, women should consider having a mammogram (breast X-ray test) every year. Women who have a family history of breast cancer  should talk to their health care provider about genetic screening. Women at a high risk of breast cancer should talk to their health care providers about having an MRI and a mammogram every year.  Breast cancer gene (BRCA)-related cancer risk assessment is recommended for women who have family members with BRCA-related cancers. BRCA-related cancers include breast, ovarian, tubal, and peritoneal cancers. Having family members with these cancers may be associated with an increased risk for harmful changes (mutations) in the breast cancer genes BRCA1 and BRCA2. Results of the assessment will determine the need for genetic counseling and BRCA1 and BRCA2 testing.  Your health care provider may recommend that you be screened regularly for cancer of the pelvic organs (ovaries, uterus, and vagina). This screening involves a pelvic examination, including checking for microscopic changes to the surface of your cervix (Pap test). You may be encouraged to have this screening done every  3 years, beginning at age 23.  For women ages 68-65, health care providers may recommend pelvic exams and Pap testing every 3 years, or they may recommend the Pap and pelvic exam, combined with testing for human papilloma virus (HPV), every 5 years. Some types of HPV increase your risk of cervical cancer. Testing for HPV may also be done on women of any age with unclear Pap test results.  Other health care providers may not recommend any screening for nonpregnant women who are considered low risk for pelvic cancer and who do not have symptoms. Ask your health care provider if a screening pelvic exam is right for you.  If you have had past treatment for cervical cancer or a condition that could lead to cancer, you need Pap tests and screening for cancer for at least 20 years after your treatment. If Pap tests have been discontinued, your risk factors (such as having a new sexual partner) need to be reassessed to determine if screening should resume. Some women have medical problems that increase the chance of getting cervical cancer. In these cases, your health care provider may recommend more frequent screening and Pap tests.  Colorectal cancer can be detected and often prevented. Most routine colorectal cancer screening begins at the age of 23 years and continues through age 7 years. However, your health care provider may recommend screening at an earlier age if you have risk factors for colon cancer. On a yearly basis, your health care provider may provide home test kits to check for hidden blood in the stool. Use of a small camera at the end of a tube, to directly examine the colon (sigmoidoscopy or colonoscopy), can detect the earliest forms of colorectal cancer. Talk to your health care provider about this at age 25, when routine screening begins. Direct exam of the colon should be repeated every 5-10 years through age 68 years, unless early forms of precancerous polyps or small growths are  found.  People who are at an increased risk for hepatitis B should be screened for this virus. You are considered at high risk for hepatitis B if:  You were born in a country where hepatitis B occurs often. Talk with your health care provider about which countries are considered high risk.  Your parents were born in a high-risk country and you have not received a shot to protect against hepatitis B (hepatitis B vaccine).  You have HIV or AIDS.  You use needles to inject street drugs.  You live with, or have sex with, someone who has hepatitis B.  You get hemodialysis treatment.  You take certain medicines for conditions like cancer, organ transplantation, and autoimmune conditions.  Hepatitis C blood testing is recommended for all people born from [birth year] through 1966 and any individual with known risks for hepatitis C.  Practice safe sex. Use condoms and avoid high-risk sexual practices to reduce the spread of sexually transmitted infections (STIs). STIs include gonorrhea, chlamydia, syphilis, trichomonas, herpes, HPV, and human immunodeficiency virus (HIV). Herpes, HIV, and HPV are viral illnesses that have no cure. They can result in disability, cancer, and death.  You should be screened for sexually transmitted illnesses (STIs) including gonorrhea and chlamydia if:  You are sexually active and are younger than 24 years.  You are older than 24 years and your health care provider tells you that you are at risk for this type of infection.  Your sexual activity has changed since you were last screened and you are at an increased risk for chlamydia or gonorrhea. Ask your health care provider if you are at risk.  If you are at risk of being infected with HIV, it is recommended that you take a prescription medicine daily to prevent HIV infection. This is called preexposure prophylaxis (PrEP). You are considered at risk if:  You are sexually active and do not regularly use condoms or know  the HIV status of your partner(s).  You take drugs by injection.  You are sexually active with a partner who has HIV.  Talk with your health care provider about whether you are at high risk of being infected with HIV. If you choose to begin PrEP, you should first be tested for HIV. You should then be tested every 3 months for as long as you are taking PrEP.  Osteoporosis is a disease in which the bones lose minerals and strength with aging. This can result in serious bone fractures or breaks. The risk of osteoporosis can be identified using a bone density scan. Women ages 32 years and over and women at risk for fractures or osteoporosis should discuss screening with their health care providers. Ask your health care provider whether you should take a calcium supplement or vitamin D to reduce the rate of osteoporosis.  Menopause can be associated with physical symptoms and risks. Hormone replacement therapy is available to decrease symptoms and risks. You should talk to your health care provider about whether hormone replacement therapy is right for you.  Use sunscreen. Apply sunscreen liberally and repeatedly throughout the day. You should seek shade when your shadow is shorter than you. Protect yourself by wearing long sleeves, pants, a wide-brimmed hat, and sunglasses year round, whenever you are outdoors.  Once a month, do a whole body skin exam, using a mirror to look at the skin on your back. Tell your health care provider of new moles, moles that have irregular borders, moles that are larger than a pencil eraser, or moles that have changed in shape or color.  Stay current with required vaccines (immunizations).  Influenza vaccine. All adults should be immunized every year.  Tetanus, diphtheria, and acellular pertussis (Td, Tdap) vaccine. Pregnant women should receive 1 dose of Tdap vaccine during each pregnancy. The dose should be obtained regardless of the length of time since the last  dose. Immunization is preferred during the 27th-36th week of gestation. An adult who has not previously received Tdap or who does not know her vaccine status should receive 1 dose of Tdap. This initial dose should be followed by tetanus and diphtheria toxoids (Td) booster doses every  10 years. Adults with an unknown or incomplete history of completing a 3-dose immunization series with Td-containing vaccines should begin or complete a primary immunization series including a Tdap dose. Adults should receive a Td booster every 10 years.  Varicella vaccine. An adult without evidence of immunity to varicella should receive 2 doses or a second dose if she has previously received 1 dose. Pregnant females who do not have evidence of immunity should receive the first dose after pregnancy. This first dose should be obtained before leaving the health care facility. The second dose should be obtained 4-8 weeks after the first dose.  Human papillomavirus (HPV) vaccine. Females aged 13-26 years who have not received the vaccine previously should obtain the 3-dose series. The vaccine is not recommended for use in pregnant females. However, pregnancy testing is not needed before receiving a dose. If a female is found to be pregnant after receiving a dose, no treatment is needed. In that case, the remaining doses should be delayed until after the pregnancy. Immunization is recommended for any person with an immunocompromised condition through the age of 57 years if she did not get any or all doses earlier. During the 3-dose series, the second dose should be obtained 4-8 weeks after the first dose. The third dose should be obtained 24 weeks after the first dose and 16 weeks after the second dose.  Zoster vaccine. One dose is recommended for adults aged 39 years or older unless certain conditions are present.  Measles, mumps, and rubella (MMR) vaccine. Adults born before [birth year] generally are considered immune to measles and  mumps. Adults born in [birth year] or later should have 1 or more doses of MMR vaccine unless there is a contraindication to the vaccine or there is laboratory evidence of immunity to each of the three diseases. A routine second dose of MMR vaccine should be obtained at least 28 days after the first dose for students attending postsecondary schools, health care workers, or international travelers. People who received inactivated measles vaccine or an unknown type of measles vaccine during 1963-1967 should receive 2 doses of MMR vaccine. People who received inactivated mumps vaccine or an unknown type of mumps vaccine before 1979 and are at high risk for mumps infection should consider immunization with 2 doses of MMR vaccine. For females of childbearing age, rubella immunity should be determined. If there is no evidence of immunity, females who are not pregnant should be vaccinated. If there is no evidence of immunity, females who are pregnant should delay immunization until after pregnancy. Unvaccinated health care workers born before [birth year] who lack laboratory evidence of measles, mumps, or rubella immunity or laboratory confirmation of disease should consider measles and mumps immunization with 2 doses of MMR vaccine or rubella immunization with 1 dose of MMR vaccine.  Pneumococcal 13-valent conjugate (PCV13) vaccine. When indicated, a person who is uncertain of his immunization history and has no record of immunization should receive the PCV13 vaccine. All adults 66 years of age and older should receive this vaccine. An adult aged 42 years or older who has certain medical conditions and has not been previously immunized should receive 1 dose of PCV13 vaccine. This PCV13 should be followed with a dose of pneumococcal polysaccharide (PPSV23) vaccine. Adults who are at high risk for pneumococcal disease should obtain the PPSV23 vaccine at least 8 weeks after the dose of PCV13 vaccine. Adults older than 36 years of age who  have normal immune system function should obtain the PPSV23 vaccine  dose at least 1 year after the dose of PCV13 vaccine.  Pneumococcal polysaccharide (PPSV23) vaccine. When PCV13 is also indicated, PCV13 should be obtained first. All adults aged 3 years and older should be immunized. An adult younger than age 14 years who has certain medical conditions should be immunized. Any person who resides in a nursing home or long-term care facility should be immunized. An adult smoker should be immunized. People with an immunocompromised condition and certain other conditions should receive both PCV13 and PPSV23 vaccines. People with human immunodeficiency virus (HIV) infection should be immunized as soon as possible after diagnosis. Immunization during chemotherapy or radiation therapy should be avoided. Routine use of PPSV23 vaccine is not recommended for American Indians, [place] Natives, or people younger than 65 years unless there are medical conditions that require PPSV23 vaccine. When indicated, people who have unknown immunization and have no record of immunization should receive PPSV23 vaccine. One-time revaccination 5 years after the first dose of PPSV23 is recommended for people aged 19-64 years who have chronic kidney failure, nephrotic syndrome, asplenia, or immunocompromised conditions. People who received 1-2 doses of PPSV23 before age 3 years should receive another dose of PPSV23 vaccine at age 30 years or later if at least 5 years have passed since the previous dose. Doses of PPSV23 are not needed for people immunized with PPSV23 at or after age 58 years.  Meningococcal vaccine. Adults with asplenia or persistent complement component deficiencies should receive 2 doses of quadrivalent meningococcal conjugate (MenACWY-D) vaccine. The doses should be obtained at least 2 months apart. Microbiologists working with certain meningococcal bacteria, [employer] recruits, people at risk during an outbreak, and  people who travel to or live in countries with a high rate of meningitis should be immunized. A first-year college student up through age 71 years who is living in a residence hall should receive a dose if she did not receive a dose on or after her 16th birthday. Adults who have certain high-risk conditions should receive one or more doses of vaccine.  Hepatitis A vaccine. Adults who wish to be protected from this disease, have certain high-risk conditions, work with hepatitis A-infected animals, work in hepatitis A research labs, or travel to or work in countries with a high rate of hepatitis A should be immunized. Adults who were previously unvaccinated and who anticipate close contact with an international adoptee during the first 60 days after arrival in the [place] from a country with a high rate of hepatitis A should be immunized.  Hepatitis B vaccine. Adults who wish to be protected from this disease, have certain high-risk conditions, may be exposed to blood or other infectious body fluids, are household contacts or sex partners of hepatitis B positive people, are clients or workers in certain care facilities, or travel to or work in countries with a high rate of hepatitis B should be immunized.  Haemophilus influenzae type b (Hib) vaccine. A previously unvaccinated person with asplenia or sickle cell disease or having a scheduled splenectomy should receive 1 dose of Hib vaccine. Regardless of previous immunization, a recipient of a hematopoietic stem cell transplant should receive a 3-dose series 6-12 months after her successful transplant. Hib vaccine is not recommended for adults with HIV infection. Preventive Services / Frequency Ages 24 to 85 years  Blood pressure check.** / Every 3-5 years.  Lipid and cholesterol check.** / Every 5 years beginning at age 17.  Clinical breast exam.** / Every 3 years for women in their 29s  and 30s.  BRCA-related cancer risk assessment.** / For women  who have family members with a BRCA-related cancer (breast, ovarian, tubal, or peritoneal cancers).  Pap test.** / Every 2 years from ages 49 through 79. Every 3 years starting at age 52 through age 40 or 20 with a history of 3 consecutive normal Pap tests.  HPV screening.** / Every 3 years from ages 57 through ages 69 to 72 with a history of 3 consecutive normal Pap tests.  Hepatitis C blood test.** / For any individual with known risks for hepatitis C.  Skin self-exam. / Monthly.  Influenza vaccine. / Every year.  Tetanus, diphtheria, and acellular pertussis (Tdap, Td) vaccine.** / Consult your health care provider. Pregnant women should receive 1 dose of Tdap vaccine during each pregnancy. 1 dose of Td every 10 years.  Varicella vaccine.** / Consult your health care provider. Pregnant females who do not have evidence of immunity should receive the first dose after pregnancy.  HPV vaccine. / 3 doses over 6 months, if 44 and younger. The vaccine is not recommended for use in pregnant females. However, pregnancy testing is not needed before receiving a dose.  Measles, mumps, rubella (MMR) vaccine.** / You need at least 1 dose of MMR if you were born in 1957 or later. You may also need a 2nd dose. For females of childbearing age, rubella immunity should be determined. If there is no evidence of immunity, females who are not pregnant should be vaccinated. If there is no evidence of immunity, females who are pregnant should delay immunization until after pregnancy.  Pneumococcal 13-valent conjugate (PCV13) vaccine.** / Consult your health care provider.  Pneumococcal polysaccharide (PPSV23) vaccine.** / 1 to 2 doses if you smoke cigarettes or if you have certain conditions.  Meningococcal vaccine.** / 1 dose if you are age 34 to 49 years and a Market researcher living in a residence hall, or have one of several medical conditions, you need to get vaccinated against meningococcal  disease. You may also need additional booster doses.  Hepatitis A vaccine.** / Consult your health care provider.  Hepatitis B vaccine.** / Consult your health care provider.  Haemophilus influenzae type b (Hib) vaccine.** / Consult your health care provider. Ages 16 to 35 years  Blood pressure check.** / Every year.  Lipid and cholesterol check.** / Every 5 years beginning at age 31 years.  Lung cancer screening. / Every year if you are aged 54-80 years and have a 30-pack-year history of smoking and currently smoke or have quit within the past 15 years. Yearly screening is stopped once you have quit smoking for at least 15 years or develop a health problem that would prevent you from having lung cancer treatment.  Clinical breast exam.** / Every year after age 15 years.  BRCA-related cancer risk assessment.** / For women who have family members with a BRCA-related cancer (breast, ovarian, tubal, or peritoneal cancers).  Mammogram.** / Every year beginning at age 35 years and continuing for as long as you are in good health. Consult with your health care provider.  Pap test.** / Every 3 years starting at age 24 years through age 65 or 66 years with a history of 3 consecutive normal Pap tests.  HPV screening.** / Every 3 years from ages 87 years through ages 64 to 75 years with a history of 3 consecutive normal Pap tests.  Fecal occult blood test (FOBT) of stool. / Every year beginning at age 2 years and continuing  until age 40 years. You may not need to do this test if you get a colonoscopy every 10 years.  Flexible sigmoidoscopy or colonoscopy.** / Every 5 years for a flexible sigmoidoscopy or every 10 years for a colonoscopy beginning at age 75 years and continuing until age 52 years.  Hepatitis C blood test.** / For all people born from [birth year] through 1966 and any individual with known risks for hepatitis C.  Skin self-exam. / Monthly.  Influenza vaccine. / Every year.  Tetanus,  diphtheria, and acellular pertussis (Tdap/Td) vaccine.** / Consult your health care provider. Pregnant women should receive 1 dose of Tdap vaccine during each pregnancy. 1 dose of Td every 10 years.  Varicella vaccine.** / Consult your health care provider. Pregnant females who do not have evidence of immunity should receive the first dose after pregnancy.  Zoster vaccine.** / 1 dose for adults aged 61 years or older.  Measles, mumps, rubella (MMR) vaccine.** / You need at least 1 dose of MMR if you were born in 1957 or later. You may also need a second dose. For females of childbearing age, rubella immunity should be determined. If there is no evidence of immunity, females who are not pregnant should be vaccinated. If there is no evidence of immunity, females who are pregnant should delay immunization until after pregnancy.  Pneumococcal 13-valent conjugate (PCV13) vaccine.** / Consult your health care provider.  Pneumococcal polysaccharide (PPSV23) vaccine.** / 1 to 2 doses if you smoke cigarettes or if you have certain conditions.  Meningococcal vaccine.** / Consult your health care provider.  Hepatitis A vaccine.** / Consult your health care provider.  Hepatitis B vaccine.** / Consult your health care provider.  Haemophilus influenzae type b (Hib) vaccine.** / Consult your health care provider. Ages 41 years and over  Blood pressure check.** / Every year.  Lipid and cholesterol check.** / Every 5 years beginning at age 45 years.  Lung cancer screening. / Every year if you are aged 50-80 years and have a 30-pack-year history of smoking and currently smoke or have quit within the past 15 years. Yearly screening is stopped once you have quit smoking for at least 15 years or develop a health problem that would prevent you from having lung cancer treatment.  Clinical breast exam.** / Every year after age 39 years.  BRCA-related cancer risk assessment.** / For women who have family  members with a BRCA-related cancer (breast, ovarian, tubal, or peritoneal cancers).  Mammogram.** / Every year beginning at age 70 years and continuing for as long as you are in good health. Consult with your health care provider.  Pap test.** / Every 3 years starting at age 43 years through age 5 or 81 years with 3 consecutive normal Pap tests. Testing can be stopped between 65 and 70 years with 3 consecutive normal Pap tests and no abnormal Pap or HPV tests in the past 10 years.  HPV screening.** / Every 3 years from ages 82 years through ages 96 or 41 years with a history of 3 consecutive normal Pap tests. Testing can be stopped between 65 and 70 years with 3 consecutive normal Pap tests and no abnormal Pap or HPV tests in the past 10 years.  Fecal occult blood test (FOBT) of stool. / Every year beginning at age 47 years and continuing until age 15 years. You may not need to do this test if you get a colonoscopy every 10 years.  Flexible sigmoidoscopy or colonoscopy.** / Every 5 years  for a flexible sigmoidoscopy or every 10 years for a colonoscopy beginning at age 51 years and continuing until age 53 years.  Hepatitis C blood test.** / For all people born from [birth year] through 1966 and any individual with known risks for hepatitis C.  Osteoporosis screening.** / A one-time screening for women ages 22 years and over and women at risk for fractures or osteoporosis.  Skin self-exam. / Monthly.  Influenza vaccine. / Every year.  Tetanus, diphtheria, and acellular pertussis (Tdap/Td) vaccine.** / 1 dose of Td every 10 years.  Varicella vaccine.** / Consult your health care provider.  Zoster vaccine.** / 1 dose for adults aged 27 years or older.  Pneumococcal 13-valent conjugate (PCV13) vaccine.** / Consult your health care provider.  Pneumococcal polysaccharide (PPSV23) vaccine.** / 1 dose for all adults aged 93 years and older.  Meningococcal vaccine.** / Consult your health care  provider.  Hepatitis A vaccine.** / Consult your health care provider.  Hepatitis B vaccine.** / Consult your health care provider.  Haemophilus influenzae type b (Hib) vaccine.** / Consult your health care provider. ** Family history and personal history of risk and conditions may change your health care provider's recommendations.   This information is not intended to replace advice given to you by your health care provider. Make sure you discuss any questions you have with your health care provider.   Document Released: 07/05/2001 Document Revised: 05/30/2014 Document Reviewed: 10/04/2010 Elsevier Interactive Patient Education [employer].

## 2016-02-10 NOTE — Progress Notes (Signed)
GYNECOLOGY ANNUAL PREVENTATIVE CARE ENCOUNTER NOTE  Subjective:   Krystal Bailey is a [age] y.o. [phone] female here for a routine annual gynecologic exam.  Current complaints: none.   Denies abnormal vaginal bleeding, discharge, pelvic pain, problems with intercourse or other gynecologic concerns.    Gynecologic History Patient's last menstrual period was 01/26/2016 (approximate). Contraception: tubal ligation Last Pap: 2 years ago. Results were: normal  Obstetric History OB History  Gravida Para Term Preterm AB Living  4 4 4     4   SAB TAB Ectopic Multiple Live Births          4    # Outcome Date GA Lbr Len/2nd Weight Sex Delivery Anes PTL Lv  4 Term      Vag-Spont   LIV  3 Term      Vag-Spont   LIV  2 Term      Vag-Spont   LIV  1 Term      Vag-Spont   LIV      Past Medical History:  Diagnosis Date  . Anxiety     Past Surgical History:  Procedure Laterality Date  . TUBAL LIGATION      Current Outpatient Prescriptions on File Prior to Visit  Medication Sig Dispense Refill  . ibuprofen (ADVIL,MOTRIN) 200 MG tablet Take 400 mg by mouth every 6 (six) hours as needed. For pain    . Phenylephrine-DM (PEDIA CARE MULTI-SYMPTOM COLD) 2.5-5 MG/5ML SOLN Take 15 mLs by mouth. For cold     No current facility-administered medications on file prior to visit.     No Known Allergies  Social History   Social History  . Marital status: Single    Spouse name: N/A  . Number of children: N/A  . Years of education: N/A   Occupational History  . Not on file.   Social History Main Topics  . Smoking status: Current Every Day Smoker    Packs/day: 1.00    Types: Cigars  . Smokeless tobacco: Never Used  . Alcohol use No  . Drug use: No  . Sexual activity: Yes    Partners: Male    Birth control/ protection: None   Other Topics Concern  . Not on file   Social History Narrative  . No narrative on file    Family History  Problem Relation Age of Onset  . Diabetes  Maternal Grandfather     The following portions of the patient's history were reviewed and updated as appropriate: allergies, current medications, past family history, past medical history, past social history, past surgical history and problem list.  Review of Systems Pertinent items noted in HPI and remainder of comprehensive ROS otherwise negative.   Objective:  BP 108/68   Pulse 61   Temp 98.9 F (37.2 C) (Oral)   Wt 113 lb 9.6 oz (51.5 kg)   LMP 01/26/2016 (Approximate)  CONSTITUTIONAL: Well-developed, well-nourished female in no acute distress.  HENT:  Normocephalic, atraumatic, External right and left ear normal. Oropharynx is clear and moist EYES: Conjunctivae and EOM are normal. Pupils are equal, round, and reactive to light. No scleral icterus.  NECK: Normal range of motion, supple, no masses.  Normal thyroid.  SKIN: Skin is warm and dry. No rash noted. Not diaphoretic. No erythema. No pallor. NEUROLOGIC: Alert and oriented to person, place, and time. Normal reflexes, muscle tone coordination. No cranial nerve deficit noted. PSYCHIATRIC: Normal mood and affect. Normal behavior. Normal judgment and thought content. CARDIOVASCULAR: Normal heart rate  noted, regular rhythm RESPIRATORY: Clear to auscultation bilaterally. Effort and breath sounds normal, no problems with respiration noted. BREASTS: Symmetric in size. No masses, skin changes, nipple drainage, or lymphadenopathy. ABDOMEN: Soft, normal bowel sounds, no distention noted.  No tenderness, rebound or guarding.  PELVIC: Normal appearing external genitalia; normal appearing vaginal mucosa and cervix.  No abnormal discharge noted.  Pap smear obtained.  Normal uterine size, no other palpable masses, no uterine or adnexal tenderness. MUSCULOSKELETAL: Normal range of motion. No tenderness.  No cyanosis, clubbing, or edema.  2+ distal pulses.   Assessment:  Annual gynecologic examination with pap smear Desires annual STI  screen   Plan:  Will follow up results of pap smear and STI screen and manage accordingly. Routine preventative health maintenance measures emphasized. Please refer to After Visit Summary for other counseling recommendations.    Jaynie Collins, MD, FACOG Attending Obstetrician & Gynecologist, [facility]

## 2016-02-11 ENCOUNTER — Encounter: Payer: Self-pay | Admitting: *Deleted

## 2016-02-11 LAB — RPR: RPR: NONREACTIVE

## 2016-02-11 LAB — HIV ANTIBODY (ROUTINE TESTING W REFLEX): HIV Screen 4th Generation wRfx: NONREACTIVE

## 2016-02-11 LAB — HEPATITIS B SURFACE ANTIGEN: HEP B S AG: NEGATIVE

## 2016-02-11 LAB — HEPATITIS C ANTIBODY

## 2016-02-13 LAB — CHLAMYDIA/GONOCOCCUS/TRICHOMONAS, NAA
CHLAMYDIA BY NAA: NEGATIVE
GONOCOCCUS BY NAA: NEGATIVE
TRICH VAG BY NAA: NEGATIVE

## 2016-02-18 ENCOUNTER — Encounter: Payer: Self-pay | Admitting: Obstetrics & Gynecology

## 2016-02-18 LAB — IGP, APTIMA HPV, RFX 16/18,45
HPV APTIMA: POSITIVE — AB
HPV GENOTYPE 16: NEGATIVE
HPV GENOTYPE 18,45: NEGATIVE
PAP Smear Comment: 0
